# Patient Record
Sex: Female | Born: 1965 | Race: White | Hispanic: No | Marital: Married | Smoking: Never smoker
Health system: Southern US, Community
[De-identification: ages and names within clinical notes are randomized; demographics above are authoritative.]

## PROBLEM LIST (undated history)

## (undated) DIAGNOSIS — R42 Dizziness and giddiness: Secondary | ICD-10-CM

## (undated) DIAGNOSIS — Z8719 Personal history of other diseases of the digestive system: Secondary | ICD-10-CM

## (undated) DIAGNOSIS — K219 Gastro-esophageal reflux disease without esophagitis: Secondary | ICD-10-CM

## (undated) HISTORY — PX: WISDOM TOOTH EXTRACTION: SHX21

## (undated) HISTORY — PX: ESOPHAGOGASTRODUODENOSCOPY: SHX1529

---

## 2007-04-01 ENCOUNTER — Ambulatory Visit: Payer: Self-pay | Admitting: Family Medicine

## 2007-04-04 ENCOUNTER — Ambulatory Visit: Payer: Self-pay | Admitting: Family Medicine

## 2009-03-22 ENCOUNTER — Ambulatory Visit: Payer: Self-pay | Admitting: Nurse Practitioner

## 2009-03-24 ENCOUNTER — Ambulatory Visit: Payer: Self-pay | Admitting: Nurse Practitioner

## 2010-04-05 ENCOUNTER — Ambulatory Visit: Payer: Self-pay | Admitting: Nurse Practitioner

## 2011-04-27 ENCOUNTER — Ambulatory Visit: Payer: Self-pay

## 2013-07-17 ENCOUNTER — Ambulatory Visit: Payer: Self-pay | Admitting: Family Medicine

## 2013-12-09 DIAGNOSIS — K219 Gastro-esophageal reflux disease without esophagitis: Secondary | ICD-10-CM | POA: Insufficient documentation

## 2014-06-22 DIAGNOSIS — Z79899 Other long term (current) drug therapy: Secondary | ICD-10-CM | POA: Insufficient documentation

## 2014-09-22 ENCOUNTER — Ambulatory Visit: Payer: Self-pay | Admitting: Family Medicine

## 2014-10-01 ENCOUNTER — Ambulatory Visit
Admit: 2014-10-01 | Disposition: A | Discharge status: Home / Self Care | Payer: Self-pay | Attending: Gastroenterology | Admitting: Gastroenterology

## 2014-10-07 ENCOUNTER — Ambulatory Visit
Admit: 2014-10-07 | Disposition: A | Discharge status: Home / Self Care | Payer: Self-pay | Attending: Family Medicine | Admitting: Family Medicine

## 2016-01-21 ENCOUNTER — Other Ambulatory Visit: Payer: Self-pay | Admitting: Family Medicine

## 2016-01-21 DIAGNOSIS — Z1231 Encounter for screening mammogram for malignant neoplasm of breast: Secondary | ICD-10-CM

## 2016-01-24 ENCOUNTER — Telehealth: Payer: Self-pay | Admitting: Gastroenterology

## 2016-01-24 NOTE — Telephone Encounter (Signed)
colonoscopy

## 2016-01-25 ENCOUNTER — Other Ambulatory Visit: Payer: Self-pay

## 2016-01-25 NOTE — Telephone Encounter (Signed)
Gastroenterology Pre-Procedure Review  Request Date: 03/13/2016 Requesting Physician: Dr. Ellison Hughs   PATIENT REVIEW QUESTIONS: The patient responded to the following health history questions as indicated:    1. Are you having any GI issues? no 2. Do you have a personal history of Polyps? no 3. Do you have a family history of Colon Cancer or Polyps? no 4. Diabetes Mellitus? no 5. Joint replacements in the past 12 months?no 6. Major health problems in the past 3 months?no 7. Any artificial heart valves, MVP, or defibrillator?no    MEDICATIONS & ALLERGIES:    Patient reports the following regarding taking any anticoagulation/antiplatelet therapy:   Plavix, Coumadin, Eliquis, Xarelto, Lovenox, Pradaxa, Brilinta, or Effient? no Aspirin? no  Patient confirms/reports the following medications:  Current Outpatient Prescriptions  Medication Sig Dispense Refill  . Cholecalciferol 2000 units CAPS Take 2,000 Units by mouth once.    . citalopram (CELEXA) 10 MG tablet Take 10 mg by mouth daily.    Marland Kitchen docusate sodium (COLACE) 100 MG capsule Take 100 mg by mouth 2 (two) times daily.    . Multiple Vitamins-Minerals (MULTIVITAMIN ADULT) TABS Take by mouth.    . omega-3 fish oil (MAXEPA) 1000 MG CAPS capsule Take by mouth.    . ranitidine (ZANTAC) 150 MG tablet Take 150 mg by mouth 2 (two) times daily.     No current facility-administered medications for this visit.     Patient confirms/reports the following allergies:  Allergies  Allergen Reactions  . Biaxin [Clarithromycin] Nausea And Vomiting  . Effexor [Venlafaxine] Other (See Comments)    Breast swelled  . Estrogens Other (See Comments)    Breast hurt   . Zoloft [Sertraline] Other (See Comments)    Breast hurt     No orders of the defined types were placed in this encounter.   AUTHORIZATION INFORMATION Primary Insurance: 1D#: Group #:  Secondary Insurance: 1D#: Group #:  SCHEDULE INFORMATION: Date:  03/13/2016 Time: Location: [facility]

## 2016-01-25 NOTE — Telephone Encounter (Signed)
Screening Colonoscopy Z12.11 [facility] [id number] Please pre-cert

## 2016-02-29 ENCOUNTER — Telehealth: Payer: Self-pay | Admitting: Gastroenterology

## 2016-02-29 NOTE — Telephone Encounter (Signed)
Patient wants to cancel her colonoscopy on 9/18. Her husband is having some heart problems. She would like for you to call her back in November to reschedule. I called [facility] to let them know.

## 2016-03-01 NOTE — Telephone Encounter (Signed)
Noted. Post dated message has been done to contact pt November 1st.

## 2016-03-13 ENCOUNTER — Encounter: Admission: RE | Payer: Self-pay | Source: Ambulatory Visit

## 2016-03-13 ENCOUNTER — Ambulatory Visit: Admission: RE | Admit: 2016-03-13 | Payer: Self-pay | Source: Ambulatory Visit | Admitting: Gastroenterology

## 2016-03-13 ENCOUNTER — Ambulatory Visit: Admit: 2016-03-13 | Payer: Self-pay | Admitting: Gastroenterology

## 2016-03-13 SURGERY — COLONOSCOPY WITH PROPOFOL
Anesthesia: Choice

## 2016-03-13 SURGERY — COLONOSCOPY WITH PROPOFOL
Anesthesia: General

## 2016-06-07 ENCOUNTER — Other Ambulatory Visit: Payer: Self-pay

## 2016-06-27 ENCOUNTER — Ambulatory Visit
Admission: RE | Admit: 2016-06-27 | Discharge: 2016-06-27 | Disposition: A | Discharge status: Home / Self Care | Payer: BLUE CROSS/BLUE SHIELD | Source: Ambulatory Visit | Attending: Family Medicine | Admitting: Family Medicine

## 2016-06-27 ENCOUNTER — Encounter: Payer: Self-pay | Admitting: Radiology

## 2016-06-27 DIAGNOSIS — Z1231 Encounter for screening mammogram for malignant neoplasm of breast: Secondary | ICD-10-CM | POA: Insufficient documentation

## 2016-06-29 ENCOUNTER — Encounter: Payer: Self-pay | Admitting: *Deleted

## 2016-07-06 NOTE — Discharge Instructions (Signed)

## 2016-07-07 ENCOUNTER — Ambulatory Visit: Payer: BLUE CROSS/BLUE SHIELD | Admitting: Anesthesiology

## 2016-07-07 ENCOUNTER — Encounter
Admission: RE | Disposition: A | Discharge status: Home / Self Care | Payer: Self-pay | Source: Ambulatory Visit | Attending: Gastroenterology

## 2016-07-07 ENCOUNTER — Ambulatory Visit
Admission: RE | Admit: 2016-07-07 | Discharge: 2016-07-07 | Disposition: A | Discharge status: Home / Self Care | Payer: BLUE CROSS/BLUE SHIELD | Source: Ambulatory Visit | Attending: Gastroenterology | Admitting: Gastroenterology

## 2016-07-07 DIAGNOSIS — K635 Polyp of colon: Secondary | ICD-10-CM

## 2016-07-07 DIAGNOSIS — Z1211 Encounter for screening for malignant neoplasm of colon: Secondary | ICD-10-CM

## 2016-07-07 DIAGNOSIS — Z79899 Other long term (current) drug therapy: Secondary | ICD-10-CM | POA: Insufficient documentation

## 2016-07-07 DIAGNOSIS — K640 First degree hemorrhoids: Secondary | ICD-10-CM | POA: Diagnosis not present

## 2016-07-07 DIAGNOSIS — K219 Gastro-esophageal reflux disease without esophagitis: Secondary | ICD-10-CM | POA: Diagnosis not present

## 2016-07-07 DIAGNOSIS — D125 Benign neoplasm of sigmoid colon: Secondary | ICD-10-CM | POA: Diagnosis not present

## 2016-07-07 HISTORY — PX: POLYPECTOMY: SHX5525

## 2016-07-07 HISTORY — DX: Personal history of other diseases of the digestive system: Z87.19

## 2016-07-07 HISTORY — DX: Gastro-esophageal reflux disease without esophagitis: K21.9

## 2016-07-07 HISTORY — PX: COLONOSCOPY WITH PROPOFOL: SHX5780

## 2016-07-07 SURGERY — COLONOSCOPY WITH PROPOFOL
Anesthesia: Monitor Anesthesia Care | Wound class: Contaminated

## 2016-07-07 MED ORDER — STERILE WATER FOR IRRIGATION IR SOLN
Status: DC | PRN
  Administered 2016-07-07: 09:00:00

## 2016-07-07 MED ORDER — OXYCODONE HCL 5 MG PO TABS: 5.0000 mg | ORAL_TABLET | Freq: Once | ORAL | Status: DC | PRN

## 2016-07-07 MED ORDER — LACTATED RINGERS IV SOLN
INTRAVENOUS | Status: DC
  Administered 2016-07-07: 09:00:00 via INTRAVENOUS

## 2016-07-07 MED ORDER — OXYCODONE HCL 5 MG/5ML PO SOLN: 5.0000 mg | Freq: Once | ORAL | Status: DC | PRN

## 2016-07-07 MED ORDER — LIDOCAINE HCL (CARDIAC) 20 MG/ML IV SOLN
INTRAVENOUS | Status: DC | PRN
  Administered 2016-07-07: 40 mg via INTRAVENOUS

## 2016-07-07 MED ORDER — PROPOFOL 10 MG/ML IV BOLUS
INTRAVENOUS | Status: DC | PRN
  Administered 2016-07-07 (×2): 50 mg via INTRAVENOUS
  Administered 2016-07-07: 100 mg via INTRAVENOUS
  Administered 2016-07-07: 50 mg via INTRAVENOUS

## 2016-07-07 SURGICAL SUPPLY — 23 items

## 2016-07-07 NOTE — Anesthesia Postprocedure Evaluation (Signed)
Anesthesia Post Note  Patient: Jessica Mcintyre  Procedure(s) Performed: Procedure(s) (LRB): COLONOSCOPY WITH PROPOFOL (N/A) POLYPECTOMY  Patient location during evaluation: PACU Anesthesia Type: MAC Level of consciousness: awake Pain management: pain level controlled Vital Signs Assessment: post-procedure vital signs reviewed and stable Respiratory status: spontaneous breathing Cardiovascular status: blood pressure returned to baseline Postop Assessment: no headache Anesthetic complications: no    Jaci Standard, III,  Isabelly Kobler D

## 2016-07-07 NOTE — H&P (Signed)
  Jessica Lame, MD [address] [place],  [address] Phone: [phone] Fax : [phone]  Primary Care Physician:  [facility], MD Primary Gastroenterologist:  Dr. Allen Norris  Pre-Procedure History & Physical: HPI:  Jessica Mcintyre is a 51 y.o. female is here for a screening colonoscopy.   Past Medical History:  Diagnosis Date  . GERD (gastroesophageal reflux disease)   . History of hiatal hernia     Past Surgical History:  Procedure Laterality Date  . ESOPHAGOGASTRODUODENOSCOPY    . WISDOM TOOTH EXTRACTION      Prior to Admission medications   Medication Sig Start Date End Date Taking? Authorizing Provider  Cholecalciferol 2000 units CAPS Take 2,000 Units by mouth once.   Yes Historical Provider, MD  citalopram (CELEXA) 10 MG tablet Take 10 mg by mouth daily.   Yes Historical Provider, MD  docusate sodium (COLACE) 100 MG capsule Take 100 mg by mouth 2 (two) times daily.   Yes Historical Provider, MD  Multiple Vitamins-Minerals (MULTIVITAMIN ADULT) TABS Take by mouth.   Yes Historical Provider, MD  omega-3 fish oil (MAXEPA) 1000 MG CAPS capsule Take by mouth.   Yes Historical Provider, MD  ranitidine (ZANTAC) 150 MG tablet Take 150 mg by mouth 2 (two) times daily.   Yes Historical Provider, MD    Allergies as of 06/07/2016 - Review Complete 01/25/2016  Allergen Reaction Noted  . Biaxin [clarithromycin] Nausea And Vomiting 01/25/2016  . Effexor [venlafaxine] Other (See Comments) 01/25/2016  . Estrogens Other (See Comments) 01/25/2016  . Zoloft [sertraline] Other (See Comments) 01/25/2016    Family History  Problem Relation Age of Onset  . Breast cancer Neg Hx     Social History   Social History  . Marital status: Married    Spouse name: N/A  . Number of children: N/A  . Years of education: N/A   Occupational History  . Not on file.   Social History Main Topics  . Smoking status: Never Smoker  . Smokeless tobacco: Never Used   Comment: smoked a little as teenager  . Alcohol use No  . Drug use: Unknown  . Sexual activity: Not on file   Other Topics Concern  . Not on file   Social History Narrative  . No narrative on file    Review of Systems: See HPI, otherwise negative ROS  Physical Exam: BP 108/63   Pulse 75   Temp 98.4 F (36.9 C) (Temporal)   Ht 5\' 6"  (1.676 m)   Wt 171 lb 9.6 oz (77.8 kg)   LMP 06/13/2016 Comment: preg test negative  SpO2 98%   BMI 27.70 kg/m  General:   Alert,  pleasant and cooperative in NAD Head:  Normocephalic and atraumatic. Neck:  Supple; no masses or thyromegaly. Lungs:  Clear throughout to auscultation.    Heart:  Regular rate and rhythm. Abdomen:  Soft, nontender and nondistended. Normal bowel sounds, without guarding, and without rebound.   Neurologic:  Alert and  oriented x4;  grossly normal neurologically.  Impression/Plan: [name] is now here to undergo a screening colonoscopy.  Risks, benefits, and alternatives regarding colonoscopy have been reviewed with the patient.  Questions have been answered.  All parties agreeable.

## 2016-07-07 NOTE — Anesthesia Procedure Notes (Signed)
Procedure Name: MAC Date/Time: 07/07/2016 8:50 AM Performed by: Janna Arch Pre-anesthesia Checklist: Patient identified, Emergency Drugs available, Suction available and Patient being monitored Patient Re-evaluated:Patient Re-evaluated prior to inductionOxygen Delivery Method: Nasal cannula

## 2016-07-07 NOTE — Anesthesia Preprocedure Evaluation (Signed)
Anesthesia Evaluation  Patient identified by MRN, date of birth, ID band Patient awake and Patient confused    Airway Mallampati: II  TM Distance: >3 FB Neck ROM: full    Dental no notable dental hx.    Pulmonary neg pulmonary ROS,    Pulmonary exam normal        Cardiovascular negative cardio ROS Normal cardiovascular exam     Neuro/Psych    GI/Hepatic Neg liver ROS, hiatal hernia, GERD  Medicated,  Endo/Other  negative endocrine ROS  Renal/GU negative Renal ROS     Musculoskeletal   Abdominal   Peds  Hematology negative hematology ROS (+)   Anesthesia Other Findings   Reproductive/Obstetrics                             Anesthesia Physical Anesthesia Plan  ASA: I  Anesthesia Plan: MAC   Post-op Pain Management:    Induction:   Airway Management Planned:   Additional Equipment:   Intra-op Plan:   Post-operative Plan:   Informed Consent: I have reviewed the patients History and Physical, chart, labs and discussed the procedure including the risks, benefits and alternatives for the proposed anesthesia with the patient or authorized representative who has indicated his/her understanding and acceptance.     Plan Discussed with:   Anesthesia Plan Comments:         Anesthesia Quick Evaluation

## 2016-07-07 NOTE — Transfer of Care (Signed)
Immediate Anesthesia Transfer of Care Note  Patient: Jessica Mcintyre  Procedure(s) Performed: Procedure(s): COLONOSCOPY WITH PROPOFOL (N/A) POLYPECTOMY  Patient Location: PACU  Anesthesia Type: MAC  Level of Consciousness: awake, alert  and patient cooperative  Airway and Oxygen Therapy: Patient Spontanous Breathing and Patient connected to supplemental oxygen  Post-op Assessment: Post-op Vital signs reviewed, Patient's Cardiovascular Status Stable, Respiratory Function Stable, Patent Airway and No signs of Nausea or vomiting  Post-op Vital Signs: Reviewed and stable  Complications: No apparent anesthesia complications

## 2016-07-07 NOTE — Op Note (Addendum)
[facility] Gastroenterology Patient Name: Jessica Mcintyre Procedure Date: 07/07/2016 8:46 AM MRN: [id number] Account #: [id number] Date of Birth: 1966-02-12 Admit Type: Outpatient Age: 51 Room: [facility] OR ROOM 01 Gender: Female Note Status: Finalized Procedure:            Colonoscopy Indications:          Screening for colorectal malignant neoplasm Providers:            Lucilla Lame MD, MD Referring MD:         Sofie Hartigan (Referring MD) Medicines:            Propofol per Anesthesia Complications:        No immediate complications. Procedure:            Pre-Anesthesia Assessment:                       - Prior to the procedure, a History and Physical was                        performed, and patient medications and allergies were                        reviewed. The patient's tolerance of previous                        anesthesia was also reviewed. The risks and benefits of                        the procedure and the sedation options and risks were                        discussed with the patient. All questions were                        answered, and informed consent was obtained. Prior                        Anticoagulants: The patient has taken no previous                        anticoagulant or antiplatelet agents. ASA Grade                        Assessment: II - A patient with mild systemic disease.                        After reviewing the risks and benefits, the patient was                        deemed in satisfactory condition to undergo the                        procedure.                       After obtaining informed consent, the colonoscope was                        passed under direct vision. Throughout the procedure,  the patient's blood pressure, pulse, and oxygen                        saturations were monitored continuously. The was                        introduced through the anus and advanced to the the               cecum, identified by appendiceal orifice and ileocecal                        valve. The colonoscopy was performed without                        difficulty. The patient tolerated the procedure well.                        The quality of the bowel preparation was excellent. Findings:      The perianal and digital rectal examinations were normal.      A 6 mm polyp was found in the sigmoid colon. The polyp was pedunculated.       The polyp was removed with a cold snare. Resection and retrieval were       complete.      A 3 mm polyp was found in the sigmoid colon. The polyp was sessile. The       polyp was removed with a cold snare. Resection and retrieval were       complete.      Non-bleeding internal hemorrhoids were found during retroflexion. The       hemorrhoids were Grade I (internal hemorrhoids that do not prolapse). Impression:           - One 6 mm polyp in the sigmoid colon, removed with a                        cold snare. Resected and retrieved.                       - One 3 mm polyp in the sigmoid colon, removed with a                        cold snare. Resected and retrieved.                       - Non-bleeding internal hemorrhoids. Recommendation:       - Discharge patient to home.                       - Resume previous diet.                       - Continue present medications.                       - Await pathology results.                       - Repeat colonoscopy in 5 years if polyp adenoma and 10                        years if hyperplastic Procedure Code(s):    ---  Professional ---                       [phone], Colonoscopy, flexible; with removal of tumor(s),                        polyp(s), or other lesion(s) by snare technique Diagnosis Code(s):    --- Professional ---                       Z12.11, Encounter for screening for malignant neoplasm                        of colon                       D12.5, Benign neoplasm of sigmoid colon CPT copyright  2016 American Medical Association. All rights reserved. The codes documented in this report are preliminary and upon coder review may  be revised to meet current compliance requirements. Lucilla Lame MD, MD 07/07/2016 9:07:19 AM This report has been signed electronically. Number of Addenda: 0 Note Initiated On: 07/07/2016 8:46 AM Scope Withdrawal Time: 0 hours 6 minutes 31 seconds  Total Procedure Duration: 0 hours 10 minutes 8 seconds       [facility]

## 2016-07-10 ENCOUNTER — Encounter: Payer: Self-pay | Admitting: Gastroenterology

## 2016-09-13 ENCOUNTER — Other Ambulatory Visit: Payer: Self-pay

## 2016-09-13 DIAGNOSIS — F419 Anxiety disorder, unspecified: Secondary | ICD-10-CM | POA: Insufficient documentation

## 2016-09-13 DIAGNOSIS — E785 Hyperlipidemia, unspecified: Secondary | ICD-10-CM | POA: Insufficient documentation

## 2017-07-17 ENCOUNTER — Other Ambulatory Visit: Payer: Self-pay | Admitting: Obstetrics and Gynecology

## 2017-07-17 DIAGNOSIS — R102 Pelvic and perineal pain: Secondary | ICD-10-CM

## 2017-07-17 DIAGNOSIS — Z1231 Encounter for screening mammogram for malignant neoplasm of breast: Secondary | ICD-10-CM

## 2017-07-26 ENCOUNTER — Ambulatory Visit
Admission: RE | Admit: 2017-07-26 | Discharge: 2017-07-26 | Disposition: A | Discharge status: Home / Self Care | Payer: BLUE CROSS/BLUE SHIELD | Source: Ambulatory Visit | Attending: Obstetrics and Gynecology | Admitting: Obstetrics and Gynecology

## 2017-07-26 DIAGNOSIS — R102 Pelvic and perineal pain: Secondary | ICD-10-CM | POA: Diagnosis not present

## 2017-07-31 ENCOUNTER — Encounter: Payer: Self-pay | Admitting: Radiology

## 2017-07-31 ENCOUNTER — Ambulatory Visit
Admission: RE | Admit: 2017-07-31 | Discharge: 2017-07-31 | Disposition: A | Discharge status: Home / Self Care | Payer: BLUE CROSS/BLUE SHIELD | Source: Ambulatory Visit | Attending: Obstetrics and Gynecology | Admitting: Obstetrics and Gynecology

## 2017-07-31 DIAGNOSIS — Z1231 Encounter for screening mammogram for malignant neoplasm of breast: Secondary | ICD-10-CM

## 2018-07-24 ENCOUNTER — Other Ambulatory Visit: Payer: Self-pay | Admitting: Obstetrics and Gynecology

## 2018-07-24 DIAGNOSIS — N644 Mastodynia: Secondary | ICD-10-CM

## 2018-07-31 ENCOUNTER — Ambulatory Visit
Admission: RE | Admit: 2018-07-31 | Discharge: 2018-07-31 | Disposition: A | Discharge status: Home / Self Care | Payer: BLUE CROSS/BLUE SHIELD | Source: Ambulatory Visit | Attending: Obstetrics and Gynecology | Admitting: Obstetrics and Gynecology

## 2018-07-31 DIAGNOSIS — N644 Mastodynia: Secondary | ICD-10-CM

## 2018-08-02 ENCOUNTER — Other Ambulatory Visit: Payer: Self-pay | Admitting: Obstetrics and Gynecology

## 2018-08-02 DIAGNOSIS — N6489 Other specified disorders of breast: Secondary | ICD-10-CM

## 2019-07-31 ENCOUNTER — Other Ambulatory Visit: Payer: Self-pay | Admitting: Obstetrics and Gynecology

## 2019-07-31 DIAGNOSIS — N6489 Other specified disorders of breast: Secondary | ICD-10-CM

## 2019-08-08 ENCOUNTER — Ambulatory Visit
Admission: RE | Admit: 2019-08-08 | Discharge: 2019-08-08 | Disposition: A | Discharge status: Home / Self Care | Payer: BC Managed Care – PPO | Source: Ambulatory Visit | Attending: Obstetrics and Gynecology | Admitting: Obstetrics and Gynecology

## 2019-08-08 DIAGNOSIS — N6489 Other specified disorders of breast: Secondary | ICD-10-CM

## 2020-08-11 ENCOUNTER — Other Ambulatory Visit: Payer: Self-pay | Admitting: Obstetrics and Gynecology

## 2020-08-11 DIAGNOSIS — R928 Other abnormal and inconclusive findings on diagnostic imaging of breast: Secondary | ICD-10-CM

## 2020-08-24 ENCOUNTER — Ambulatory Visit
Admission: RE | Admit: 2020-08-24 | Discharge: 2020-08-24 | Disposition: A | Discharge status: Home / Self Care | Payer: BC Managed Care – PPO | Source: Ambulatory Visit | Attending: Obstetrics and Gynecology | Admitting: Obstetrics and Gynecology

## 2020-08-24 ENCOUNTER — Other Ambulatory Visit: Payer: Self-pay

## 2020-08-24 DIAGNOSIS — R928 Other abnormal and inconclusive findings on diagnostic imaging of breast: Secondary | ICD-10-CM | POA: Diagnosis present

## 2021-03-09 ENCOUNTER — Telehealth: Payer: Self-pay

## 2021-03-09 NOTE — Telephone Encounter (Signed)
Pt. Calling to schedule a colonoscopy

## 2021-03-10 ENCOUNTER — Telehealth: Payer: Self-pay

## 2021-03-10 NOTE — Telephone Encounter (Signed)
Called patient back from phone note patient wanted to know if her insurance would cover a colonoscopy

## 2021-04-19 ENCOUNTER — Telehealth: Payer: Self-pay

## 2021-04-19 ENCOUNTER — Encounter: Payer: Self-pay | Admitting: Gastroenterology

## 2021-04-19 ENCOUNTER — Other Ambulatory Visit: Payer: Self-pay

## 2021-04-19 DIAGNOSIS — Z86010 Personal history of colonic polyps: Secondary | ICD-10-CM

## 2021-04-19 MED ORDER — NA SULFATE-K SULFATE-MG SULF 17.5-3.13-1.6 GM/177ML PO SOLN: 1.0000 | Freq: Once | ORAL | 0 refills | Status: AC

## 2021-04-19 NOTE — Progress Notes (Signed)
Gastroenterology Pre-Procedure Review  Request Date: 05/02/21 Requesting Physician: Dr. Allen Norris  PATIENT REVIEW QUESTIONS: The patient responded to the following health history questions as indicated:    1. Are you having any GI issues? no 2. Do you have a personal history of Polyps? yes (07/07/2016 polyps removed.) 3. Do you have a family history of Colon Cancer or Polyps? no 4. Diabetes Mellitus? no 5. Joint replacements in the past 12 months?no 6. Major health problems in the past 3 months?no 7. Any artificial heart valves, MVP, or defibrillator?no    MEDICATIONS & ALLERGIES:    Patient reports the following regarding taking any anticoagulation/antiplatelet therapy:   Plavix, Coumadin, Eliquis, Xarelto, Lovenox, Pradaxa, Brilinta, or Effient? no Aspirin? no  Patient confirms/reports the following medications:  Current Outpatient Medications  Medication Sig Dispense Refill   Cholecalciferol 2000 units CAPS Take 2,000 Units by mouth once.     citalopram (CELEXA) 10 MG tablet Take 10 mg by mouth daily.     docusate sodium (COLACE) 100 MG capsule Take 100 mg by mouth 2 (two) times daily.     Multiple Vitamin (MULTI-VITAMINS) TABS Take by mouth.     Multiple Vitamins-Minerals (MULTIVITAMIN ADULT) TABS Take by mouth.     omega-3 fish oil (MAXEPA) 1000 MG CAPS capsule Take by mouth.     ranitidine (ZANTAC) 150 MG tablet Take 150 mg by mouth 2 (two) times daily.     No current facility-administered medications for this visit.    Patient confirms/reports the following allergies:  Allergies  Allergen Reactions   Biaxin [Clarithromycin] Nausea And Vomiting   Effexor [Venlafaxine] Other (See Comments)    Other reaction(s): Other (See Comments) Breast swelled Breast swelled   Estrogens Other (See Comments)    Other reaction(s): Other (See Comments) Breast hurt Breast hurt    Zoloft [Sertraline] Other (See Comments)    Other reaction(s): Other (See Comments) Breast hurt Breast hurt      No orders of the defined types were placed in this encounter.   AUTHORIZATION INFORMATION Primary Insurance: 1D#: Group #:  Secondary Insurance: 1D#: Group #:  SCHEDULE INFORMATION: Date: 05/02/21 Time: Location: [facility]

## 2021-04-19 NOTE — Telephone Encounter (Signed)
Procedure scheduled for 05/02/21.

## 2021-04-19 NOTE — Telephone Encounter (Signed)
Pt. Calling to schedule colonoscopy she says she would like to have it done before the end of November.

## 2021-04-20 ENCOUNTER — Encounter: Payer: Self-pay | Admitting: Gastroenterology

## 2021-04-27 DIAGNOSIS — U071 COVID-19: Secondary | ICD-10-CM

## 2021-04-27 HISTORY — DX: COVID-19: U07.1

## 2021-05-09 ENCOUNTER — Encounter: Payer: Self-pay | Admitting: Gastroenterology

## 2021-05-23 ENCOUNTER — Ambulatory Visit: Payer: BC Managed Care – PPO | Admitting: Anesthesiology

## 2021-05-23 ENCOUNTER — Other Ambulatory Visit: Payer: Self-pay

## 2021-05-23 ENCOUNTER — Ambulatory Visit
Admission: RE | Admit: 2021-05-23 | Discharge: 2021-05-23 | Disposition: A | Discharge status: Home / Self Care | Payer: BC Managed Care – PPO | Attending: Gastroenterology | Admitting: Gastroenterology

## 2021-05-23 ENCOUNTER — Ambulatory Visit
Admission: RE | Disposition: A | Discharge status: Home / Self Care | Payer: Self-pay | Source: Home / Self Care | Attending: Gastroenterology

## 2021-05-23 ENCOUNTER — Encounter: Payer: Self-pay | Admitting: Gastroenterology

## 2021-05-23 DIAGNOSIS — D124 Benign neoplasm of descending colon: Secondary | ICD-10-CM | POA: Insufficient documentation

## 2021-05-23 DIAGNOSIS — Z1211 Encounter for screening for malignant neoplasm of colon: Secondary | ICD-10-CM | POA: Insufficient documentation

## 2021-05-23 DIAGNOSIS — K449 Diaphragmatic hernia without obstruction or gangrene: Secondary | ICD-10-CM | POA: Insufficient documentation

## 2021-05-23 DIAGNOSIS — K635 Polyp of colon: Secondary | ICD-10-CM | POA: Diagnosis not present

## 2021-05-23 DIAGNOSIS — K219 Gastro-esophageal reflux disease without esophagitis: Secondary | ICD-10-CM | POA: Diagnosis not present

## 2021-05-23 DIAGNOSIS — Z860100 Personal history of colon polyps, unspecified: Secondary | ICD-10-CM

## 2021-05-23 DIAGNOSIS — K640 First degree hemorrhoids: Secondary | ICD-10-CM | POA: Diagnosis not present

## 2021-05-23 HISTORY — PX: COLONOSCOPY WITH PROPOFOL: SHX5780

## 2021-05-23 HISTORY — DX: Dizziness and giddiness: R42

## 2021-05-23 HISTORY — PX: POLYPECTOMY: SHX5525

## 2021-05-23 SURGERY — COLONOSCOPY WITH PROPOFOL
Anesthesia: General | Site: Rectum

## 2021-05-23 MED ORDER — LIDOCAINE HCL (CARDIAC) PF 100 MG/5ML IV SOSY
PREFILLED_SYRINGE | INTRAVENOUS | Status: DC | PRN
  Administered 2021-05-23: 30 mg via INTRAVENOUS

## 2021-05-23 MED ORDER — STERILE WATER FOR IRRIGATION IR SOLN
Status: DC | PRN
  Administered 2021-05-23: 1

## 2021-05-23 MED ORDER — PROPOFOL 10 MG/ML IV BOLUS
INTRAVENOUS | Status: DC | PRN
  Administered 2021-05-23 (×4): 40 mg via INTRAVENOUS
  Administered 2021-05-23: 60 mg via INTRAVENOUS
  Administered 2021-05-23: 40 mg via INTRAVENOUS

## 2021-05-23 MED ORDER — ACETAMINOPHEN 160 MG/5ML PO SOLN: 325.0000 mg | ORAL | Status: DC | PRN

## 2021-05-23 MED ORDER — ONDANSETRON HCL 4 MG/2ML IJ SOLN: 4.0000 mg | Freq: Once | INTRAMUSCULAR | Status: DC | PRN

## 2021-05-23 MED ORDER — ACETAMINOPHEN 325 MG PO TABS: 325.0000 mg | ORAL_TABLET | ORAL | Status: DC | PRN

## 2021-05-23 MED ORDER — LACTATED RINGERS IV SOLN: INTRAVENOUS | Status: DC

## 2021-05-23 MED ORDER — SODIUM CHLORIDE 0.9 % IV SOLN: INTRAVENOUS | Status: DC

## 2021-05-23 SURGICAL SUPPLY — 7 items
FORCEPS BIOP RAD 4 LRG CAP 4 (CUTTING FORCEPS) ×3 IMPLANT
GOWN CVR UNV OPN BCK APRN NK (MISCELLANEOUS) ×4 IMPLANT
GOWN ISOL THUMB LOOP REG UNIV (MISCELLANEOUS) ×6
KIT PRC NS LF DISP ENDO (KITS) ×2 IMPLANT
KIT PROCEDURE OLYMPUS (KITS) ×3
MANIFOLD NEPTUNE II (INSTRUMENTS) ×3 IMPLANT
WATER STERILE IRR 250ML POUR (IV SOLUTION) ×3 IMPLANT

## 2021-05-23 NOTE — Anesthesia Procedure Notes (Signed)
Procedure Name: MAC Date/Time: 05/23/2021 8:14 AM Performed by: Georga Bora, CRNA Pre-anesthesia Checklist: Patient identified, Emergency Drugs available, Suction available, Patient being monitored and Timeout performed Patient Re-evaluated:Patient Re-evaluated prior to induction Oxygen Delivery Method: Nasal cannula Placement Confirmation: positive ETCO2 and breath sounds checked- equal and bilateral

## 2021-05-23 NOTE — Op Note (Signed)
[facility] Gastroenterology Patient Name: Jessica Mcintyre Procedure Date: 05/23/2021 7:58 AM MRN: [id number] Account #: [id number] Date of Birth: 11/07/1965 Admit Type: Outpatient Age: 55 Room: [facility] OR ROOM 01 Gender: Female Note Status: Finalized Instrument Name: [id number] Procedure:             Colonoscopy Indications:           High risk colon cancer surveillance: Personal history                         of colonic polyps Providers:             Lucilla Lame MD, MD Referring MD:          Sofie Hartigan (Referring MD) Medicines:             Propofol per Anesthesia Complications:         No immediate complications. Procedure:             Pre-Anesthesia Assessment:                        - Prior to the procedure, a History and Physical was                         performed, and patient medications and allergies were                         reviewed. The patient's tolerance of previous                         anesthesia was also reviewed. The risks and benefits                         of the procedure and the sedation options and risks                         were discussed with the patient. All questions were                         answered, and informed consent was obtained. Prior                         Anticoagulants: The patient has taken no previous                         anticoagulant or antiplatelet agents. ASA Grade                         Assessment: II - A patient with mild systemic disease.                         After reviewing the risks and benefits, the patient                         was deemed in satisfactory condition to undergo the                         procedure.  After obtaining informed consent, the colonoscope was                         passed under direct vision. Throughout the procedure,                         the patient's blood pressure, pulse, and oxygen                         saturations were monitored  continuously. The                         Colonoscope was introduced through the anus and                         advanced to the the cecum, identified by appendiceal                         orifice and ileocecal valve. The colonoscopy was                         performed without difficulty. The patient tolerated                         the procedure well. The quality of the bowel                         preparation was excellent. Findings:      The perianal and digital rectal examinations were normal.      Two sessile polyps were found in the descending colon. The polyps were 3       to 4 mm in size. These polyps were removed with a cold biopsy forceps.       Resection and retrieval were complete.      Non-bleeding internal hemorrhoids were found during retroflexion. The       hemorrhoids were Grade I (internal hemorrhoids that do not prolapse). Impression:            - Two 3 to 4 mm polyps in the descending colon,                         removed with a cold biopsy forceps. Resected and                         retrieved.                        - Non-bleeding internal hemorrhoids. Recommendation:        - Discharge patient to home.                        - Resume previous diet.                        - Continue present medications.                        - Await pathology results.                        - Repeat  colonoscopy in 5 years for surveillance. Procedure Code(s):     --- Professional ---                        [phone], Colonoscopy, flexible; with biopsy, single or                         multiple Diagnosis Code(s):     --- Professional ---                        Z86.010, Personal history of colonic polyps                        K63.5, Polyp of colon CPT copyright 2019 American Medical Association. All rights reserved. The codes documented in this report are preliminary and upon coder review may  be revised to meet current compliance requirements. Lucilla Lame MD, MD 05/23/2021  8:28:32 AM This report has been signed electronically. Number of Addenda: 0 Note Initiated On: 05/23/2021 7:58 AM Scope Withdrawal Time: 0 hours 8 minutes 26 seconds  Total Procedure Duration: 0 hours 11 minutes 56 seconds  Estimated Blood Loss:  Estimated blood loss: none.      [facility]

## 2021-05-23 NOTE — H&P (Signed)
Lucilla Lame, MD [address] [place], [place] [address] Phone:[phone] Fax : [phone]  Primary Care Physician:  Sofie Hartigan, MD Primary Gastroenterologist:  Dr. Allen Norris  Pre-Procedure History & Physical: HPI:  Jessica Mcintyre is a 55 y.o. female is here for an colonoscopy.   Past Medical History:  Diagnosis Date   COVID-19 04/27/2021   Cough, Fever, body Aches.  Resolved 05/04/21   GERD (gastroesophageal reflux disease)    History of hiatal hernia    Vertigo    x1.  2021    Past Surgical History:  Procedure Laterality Date   COLONOSCOPY WITH PROPOFOL N/A 07/07/2016   Procedure: COLONOSCOPY WITH PROPOFOL;  Surgeon: Lucilla Lame, MD;  Location: [place];  Service: Endoscopy;  Laterality: N/A;   ESOPHAGOGASTRODUODENOSCOPY     POLYPECTOMY  07/07/2016   Procedure: POLYPECTOMY;  Surgeon: Lucilla Lame, MD;  Location: [place];  Service: Endoscopy;;   WISDOM TOOTH EXTRACTION      Prior to Admission medications   Medication Sig Start Date End Date Taking? Authorizing Provider  Cholecalciferol 2000 units CAPS Take 2,000 Units by mouth once.   Yes [provider]  citalopram (CELEXA) 10 MG tablet Take 10 mg by mouth daily.   Yes [provider]  Multiple Vitamins-Minerals (MULTIVITAMIN ADULT) TABS Take by mouth.   Yes [provider]  omega-3 fish oil (MAXEPA) 1000 MG CAPS capsule Take by mouth.   Yes [provider]  docusate sodium (COLACE) 100 MG capsule Take 100 mg by mouth 2 (two) times daily. Patient not taking: Reported on 04/19/2021    [provider]    Allergies as of 04/19/2021 - Review Complete 07/07/2016  Allergen Reaction Noted   Biaxin [clarithromycin] Nausea And Vomiting 09/22/2013   Effexor [venlafaxine] Other (See Comments) 09/22/2013   Estrogens Other (See Comments) 09/22/2013   Zoloft [sertraline] Other (See Comments) 09/22/2013    Family History  Problem  Relation Age of Onset   Breast cancer Sister 16    Social History   Socioeconomic History   Marital status: Married    Spouse name: Not on file   Number of children: Not on file   Years of education: Not on file   Highest education level: Not on file  Occupational History   Not on file  Tobacco Use   Smoking status: Never   Smokeless tobacco: Never   Tobacco comments:    smoked a little as teenager  Vaping Use   Vaping Use: Never used  Substance and Sexual Activity   Alcohol use: No   Drug use: Not on file   Sexual activity: Not on file  Other Topics Concern   Not on file  Social History Narrative   Not on file   Social Determinants of Health   Financial Resource Strain: Not on file  Food Insecurity: Not on file  Transportation Needs: Not on file  Physical Activity: Not on file  Stress: Not on file  Social Connections: Not on file  Intimate Partner Violence: Not on file    Review of Systems: See HPI, otherwise negative ROS  Physical Exam: BP 115/66   Pulse 68   Temp 97.7 F (36.5 C) (Temporal)   Resp 18   Ht 5\' 6"  (1.676 m)   Wt 71.2 kg   LMP 06/13/2016   SpO2 98%   BMI 25.34 kg/m  General:   Alert,  pleasant and cooperative in NAD Head:  Normocephalic and atraumatic. Neck:  Supple; no masses or thyromegaly. Lungs:  Clear throughout to auscultation.    Heart:  Regular rate and rhythm. Abdomen:  Soft, nontender and nondistended. Normal bowel sounds, without guarding, and without rebound.   Neurologic:  Alert and  oriented x4;  grossly normal neurologically.  Impression/Plan: Jessica Mcintyre is here for an colonoscopy to be performed for a history of adenomatous polyps on 2018   Risks, benefits, limitations, and alternatives regarding  colonoscopy have been reviewed with the patient.  Questions have been answered.  All parties agreeable.   Lucilla Lame, MD  05/23/2021, 7:59 AM

## 2021-05-23 NOTE — Anesthesia Postprocedure Evaluation (Signed)
Anesthesia Post Note  Patient: Jessica Mcintyre  Procedure(s) Performed: COLONOSCOPY WITH PROPOFOL (Rectum)     Patient location during evaluation: PACU Anesthesia Type: General Level of consciousness: awake Pain management: pain level controlled Vital Signs Assessment: post-procedure vital signs reviewed and stable Respiratory status: respiratory function stable Cardiovascular status: stable Postop Assessment: no signs of nausea or vomiting Anesthetic complications: no   No notable events documented.  Veda Canning

## 2021-05-23 NOTE — Anesthesia Preprocedure Evaluation (Signed)
Anesthesia Evaluation  Patient identified by MRN, date of birth, ID band Patient awake    Reviewed: Allergy & Precautions, NPO status   Airway Mallampati: II  TM Distance: >3 FB     Dental   Pulmonary    Pulmonary exam normal        Cardiovascular  Rhythm:Regular Rate:Normal  HLD   Neuro/Psych Anxiety    GI/Hepatic hiatal hernia, GERD  ,  Endo/Other    Renal/GU      Musculoskeletal   Abdominal   Peds  Hematology   Anesthesia Other Findings   Reproductive/Obstetrics                             Anesthesia Physical Anesthesia Plan  ASA: 2  Anesthesia Plan: General   Post-op Pain Management:    Induction: Intravenous  PONV Risk Score and Plan: Propofol infusion, TIVA and Treatment may vary due to age or medical condition  Airway Management Planned: Natural Airway and Nasal Cannula  Additional Equipment:   Intra-op Plan:   Post-operative Plan:   Informed Consent: I have reviewed the patients History and Physical, chart, labs and discussed the procedure including the risks, benefits and alternatives for the proposed anesthesia with the patient or authorized representative who has indicated his/her understanding and acceptance.       Plan Discussed with: CRNA  Anesthesia Plan Comments:         Anesthesia Quick Evaluation

## 2021-05-23 NOTE — Transfer of Care (Signed)
Immediate Anesthesia Transfer of Care Note  Patient: Jessica Mcintyre  Procedure(s) Performed: COLONOSCOPY WITH PROPOFOL (Rectum)  Patient Location: PACU  Anesthesia Type: General  Level of Consciousness: awake, alert  and patient cooperative  Airway and Oxygen Therapy: Patient Spontanous Breathing and Patient connected to supplemental oxygen  Post-op Assessment: Post-op Vital signs reviewed, Patient's Cardiovascular Status Stable, Respiratory Function Stable, Patent Airway and No signs of Nausea or vomiting  Post-op Vital Signs: Reviewed and stable  Complications: No notable events documented.

## 2021-05-24 ENCOUNTER — Encounter: Payer: Self-pay | Admitting: Gastroenterology

## 2021-05-24 LAB — SURGICAL PATHOLOGY

## 2021-05-25 ENCOUNTER — Encounter: Payer: Self-pay | Admitting: Gastroenterology

## 2021-05-25 NOTE — Progress Notes (Signed)
55

## 2021-06-03 ENCOUNTER — Encounter: Payer: Self-pay | Admitting: Gastroenterology

## 2021-06-03 NOTE — Progress Notes (Signed)
Polypectomy needed to be added to procedures due to the removal of a polyp

## 2023-02-20 IMAGING — MG DIGITAL DIAGNOSTIC BILAT W/ TOMO W/ CAD
8 series · 8 of 24 positions shown · non-contrast
Comparison: Previous exam(s).

CLINICAL DATA: Follow-up probably benign asymmetrical
fibroglandular tissue in the outer left breast with no correlating
sonographic abnormality.

EXAM:
DIGITAL DIAGNOSTIC BILATERAL MAMMOGRAM WITH TOMOSYNTHESIS AND CAD
TECHNIQUE: Bilateral digital diagnostic mammography and breast tomosynthesis
was performed. The images were evaluated with computer-aided
detection.

[L CC synth-2D]
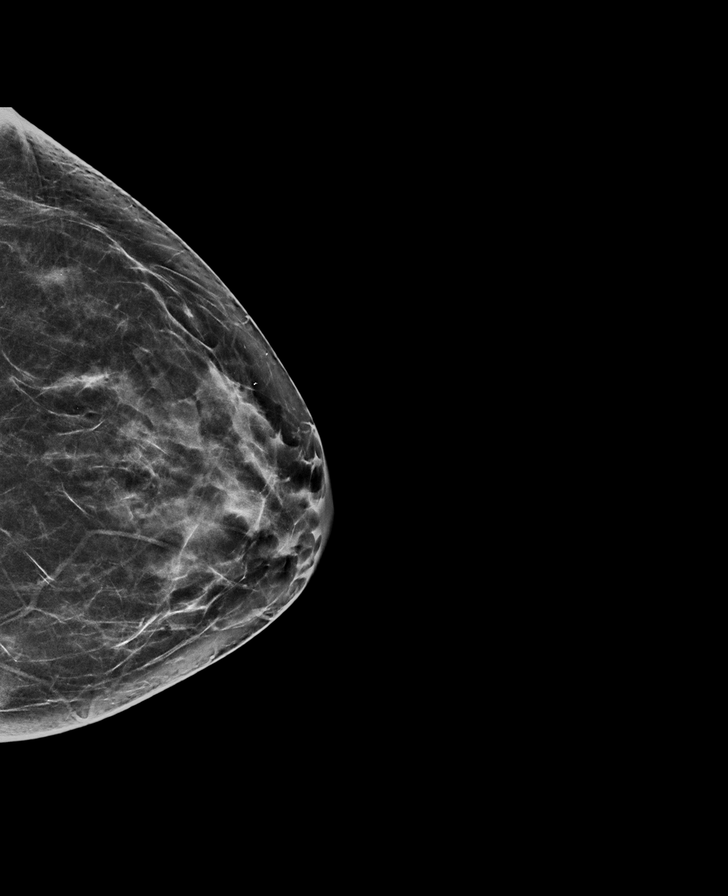

[R MLO synth-2D]
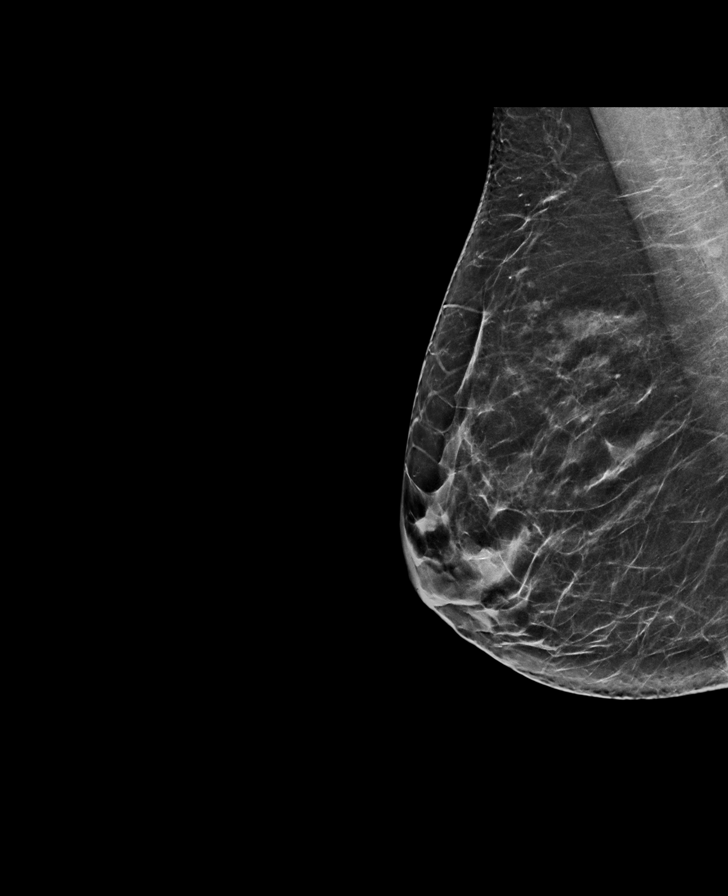

[L MLO synth-2D]
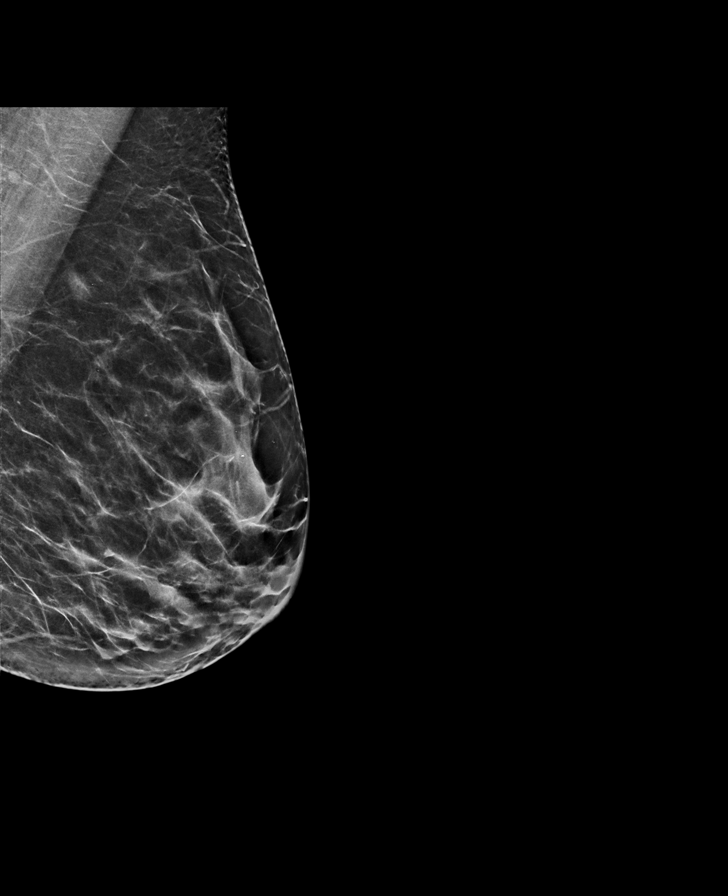

[R CC synth-2D]
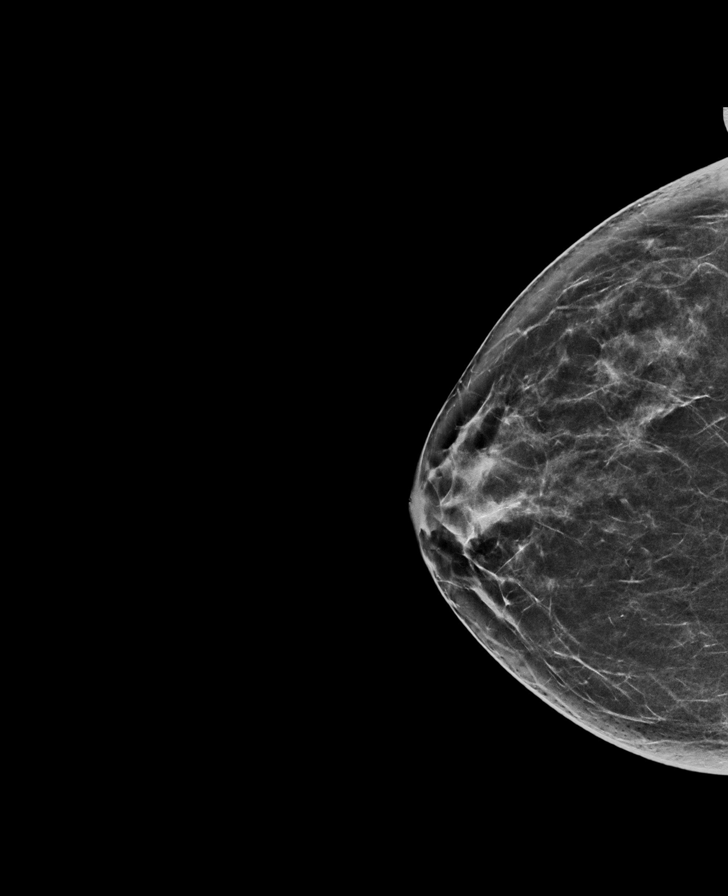

[L CC tomo · tomo slice 35/68.0]
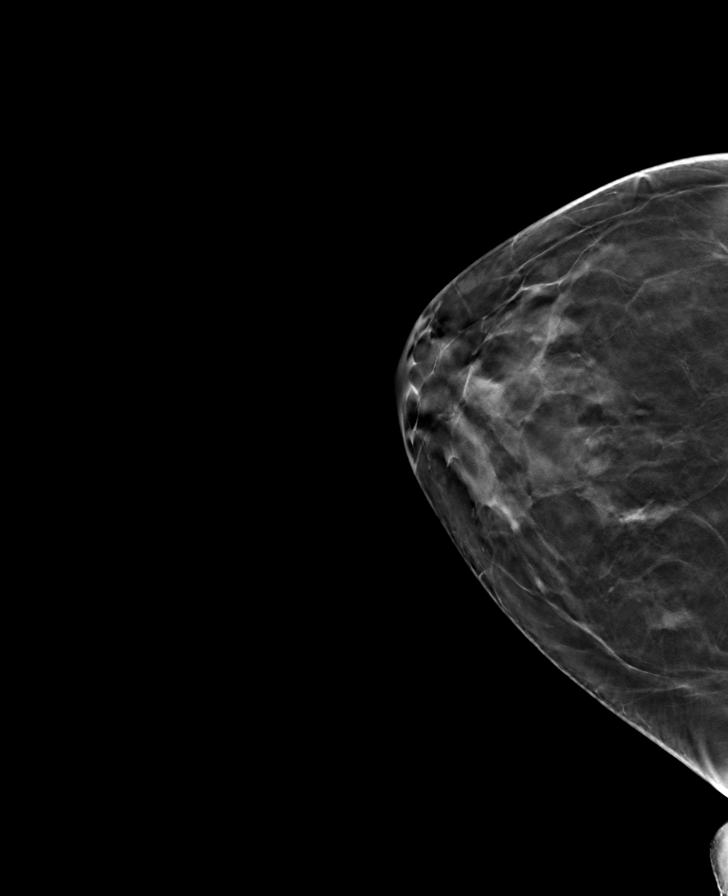

[L MLO tomo · tomo slice 35/68.0]
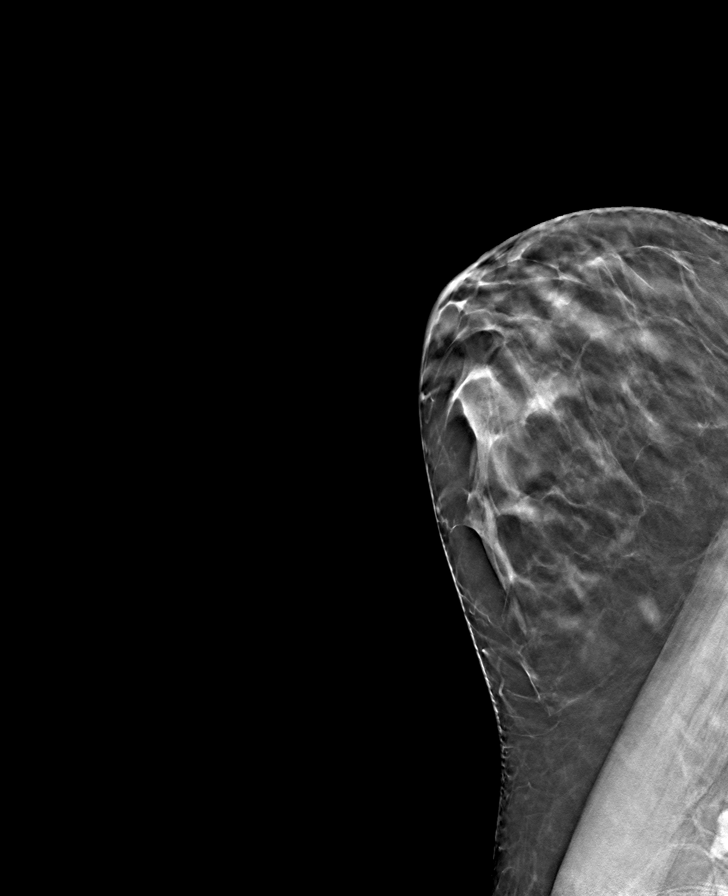

[R CC tomo · tomo slice 33/66.0]
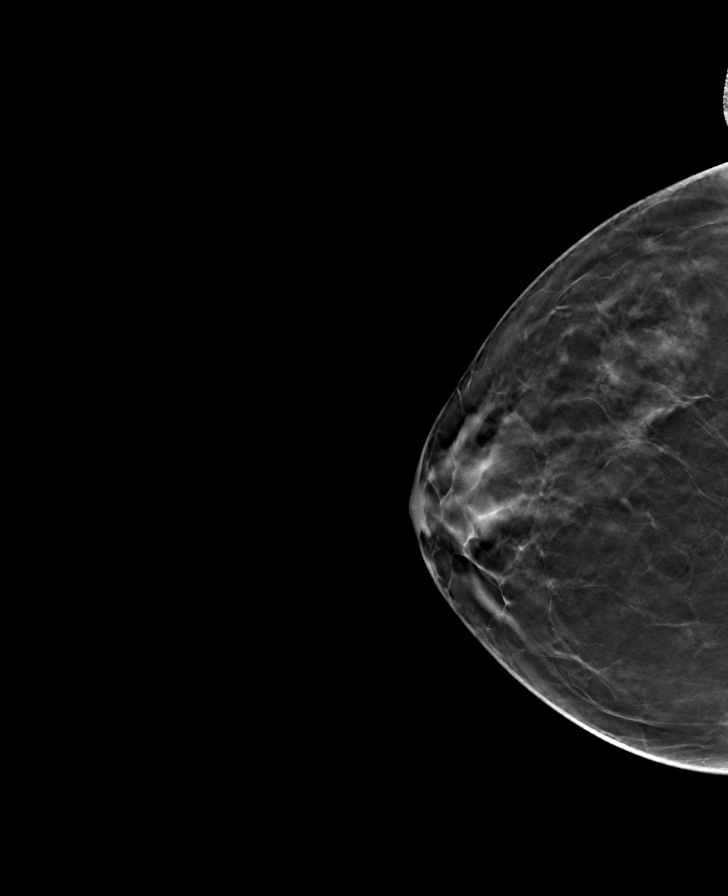

[R MLO tomo · tomo slice 36/71.0]
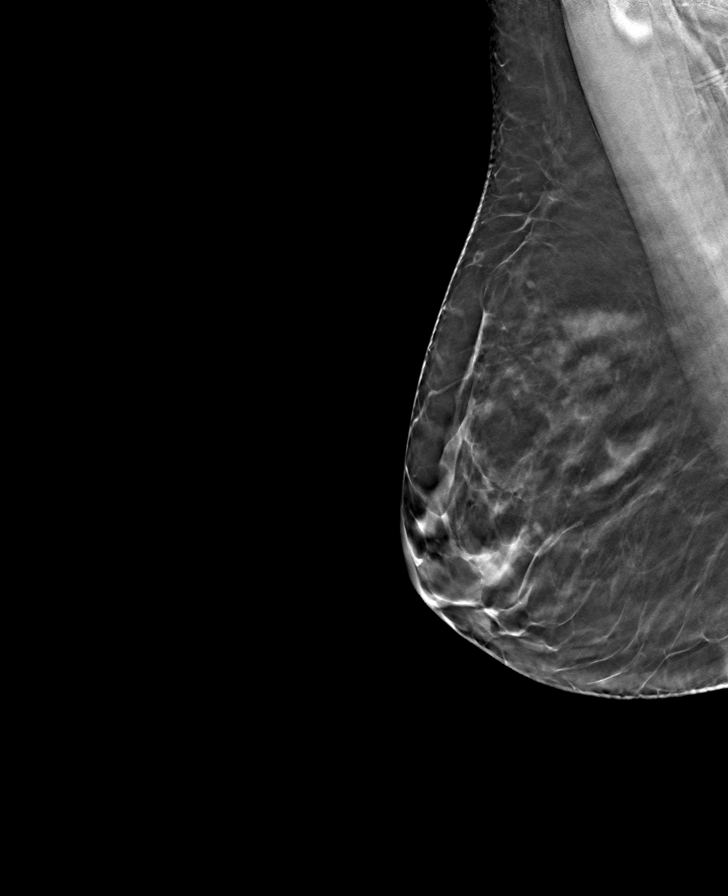

[8 of 24 positions shown; findings below may reference images not displayed]

ACR Breast Density Category c: The breast tissue is heterogeneously
dense, which may obscure small masses.
FINDINGS: A small, focal area asymmetrical density in the posterior upper
outer left breast is stable in the oblique projection since
07/31/2018 and smaller in the craniocaudal projection. No interval
findings suspicious for malignancy in either breast.
IMPRESSION: 1. Previously demonstrated probably benign asymmetry in the
posterior upper outer left breast is smaller in the craniocaudal
projection in unchanged in the oblique projection compared to
previous examinations dating back 2 years ago. This is compatible
with a benign asymmetry and does not need further follow-up.
2. No evidence of malignancy in either breast.

RECOMMENDATION:
Bilateral screening mammogram in 1 year.

I have discussed the findings and recommendations with the patient.
If applicable, a reminder letter will be sent to the patient
regarding the next appointment.

BI-RADS CATEGORY  2: Benign.
